# Patient Record
Sex: Female | Born: 1980 | Race: Black or African American | Hispanic: No | Marital: Single | Smoking: Never smoker
Health system: Southern US, Community
[De-identification: ages and names within clinical notes are randomized; demographics above are authoritative.]

## PROBLEM LIST (undated history)

## (undated) DIAGNOSIS — G35 Multiple sclerosis: Secondary | ICD-10-CM

## (undated) DIAGNOSIS — G35D Multiple sclerosis, unspecified: Secondary | ICD-10-CM

---

## 2011-11-17 ENCOUNTER — Emergency Department: Payer: Self-pay | Admitting: Emergency Medicine

## 2011-11-17 LAB — CK: CK, Total: 101 U/L (ref 21–215)

## 2011-11-17 LAB — CBC
HCT: 32.1 % — ABNORMAL LOW (ref 35.0–47.0)
HGB: 10.8 g/dL — ABNORMAL LOW (ref 12.0–16.0)
MCH: 29.2 pg (ref 26.0–34.0)
MCV: 87 fL (ref 80–100)
RDW: 14.7 % — ABNORMAL HIGH (ref 11.5–14.5)

## 2011-11-17 LAB — COMPREHENSIVE METABOLIC PANEL
Albumin: 3.3 g/dL — ABNORMAL LOW (ref 3.4–5.0)
Alkaline Phosphatase: 45 U/L — ABNORMAL LOW (ref 50–136)
Anion Gap: 10 (ref 7–16)
Bilirubin,Total: 0.5 mg/dL (ref 0.2–1.0)
Calcium, Total: 8.3 mg/dL — ABNORMAL LOW (ref 8.5–10.1)
Chloride: 109 mmol/L — ABNORMAL HIGH (ref 98–107)
Co2: 22 mmol/L (ref 21–32)
EGFR (African American): >60
Glucose: 115 mg/dL — ABNORMAL HIGH (ref 65–99)
Potassium: 3.4 mmol/L — ABNORMAL LOW (ref 3.5–5.1)
SGOT(AST): 17 U/L (ref 15–37)
SGPT (ALT): 18 U/L
Sodium: 141 mmol/L (ref 136–145)

## 2011-11-17 LAB — ETHANOL: Ethanol: <3 mg/dL

## 2016-12-05 ENCOUNTER — Emergency Department (HOSPITAL_COMMUNITY)
Admission: EM | Admit: 2016-12-05 | Discharge: 2016-12-05 | Discharge status: Against Medical Advice | Payer: Medicare Other | Attending: Emergency Medicine | Admitting: Emergency Medicine

## 2016-12-05 ENCOUNTER — Encounter (HOSPITAL_COMMUNITY): Payer: Self-pay | Admitting: Emergency Medicine

## 2016-12-05 DIAGNOSIS — R1110 Vomiting, unspecified: Secondary | ICD-10-CM | POA: Insufficient documentation

## 2016-12-05 DIAGNOSIS — R1031 Right lower quadrant pain: Secondary | ICD-10-CM | POA: Insufficient documentation

## 2016-12-05 HISTORY — DX: Multiple sclerosis: G35

## 2016-12-05 HISTORY — DX: Multiple sclerosis, unspecified: G35.D

## 2016-12-05 LAB — COMPREHENSIVE METABOLIC PANEL
ALT: 16 U/L (ref 14–54)
AST: 23 U/L (ref 15–41)
Albumin: 4.2 g/dL (ref 3.5–5.0)
Alkaline Phosphatase: 54 U/L (ref 38–126)
Anion gap: 10 (ref 5–15)
BUN: 7 mg/dL (ref 6–20)
CHLORIDE: 104 mmol/L (ref 101–111)
CO2: 23 mmol/L (ref 22–32)
Calcium: 9.4 mg/dL (ref 8.9–10.3)
Creatinine, Ser: 0.68 mg/dL (ref 0.44–1.00)
GFR calc Af Amer: >60 mL/min (ref >60)
GFR calc non Af Amer: >60 mL/min (ref >60)
Glucose, Bld: 117 mg/dL — ABNORMAL HIGH (ref 65–99)
Potassium: 3.7 mmol/L (ref 3.5–5.1)
SODIUM: 137 mmol/L (ref 135–145)
Total Bilirubin: 0.4 mg/dL (ref 0.3–1.2)
Total Protein: 7.2 g/dL (ref 6.5–8.1)

## 2016-12-05 LAB — CBC
HCT: 38.8 % (ref 36.0–46.0)
Hemoglobin: 12.8 g/dL (ref 12.0–15.0)
MCH: 29 pg (ref 26.0–34.0)
MCHC: 33 g/dL (ref 30.0–36.0)
MCV: 87.8 fL (ref 78.0–100.0)
PLATELETS: 231 10*3/uL (ref 150–400)
RBC: 4.42 MIL/uL (ref 3.87–5.11)
RDW: 13.9 % (ref 11.5–15.5)
WBC: 7.2 10*3/uL (ref 4.0–10.5)

## 2016-12-05 LAB — LIPASE, BLOOD: LIPASE: 37 U/L (ref 11–51)

## 2016-12-05 LAB — HCG, QUANTITATIVE, PREGNANCY: hCG, Beta Chain, Quant, S: <1 m[IU]/mL (ref <5)

## 2016-12-05 MED ORDER — ONDANSETRON 4 MG PO TBDP
ORAL_TABLET | ORAL | Status: AC
  Filled 2016-12-05: qty 1

## 2016-12-05 MED ORDER — ONDANSETRON 4 MG PO TBDP: 4.0000 mg | ORAL_TABLET | Freq: Once | ORAL | Status: DC | PRN

## 2016-12-05 NOTE — ED Triage Notes (Signed)
Pt woke up around 1am complaining of RLQ pain.  Pt is also been vomiting.  No fever.  Spouse states that she has been crying in pain for the past two hours.

## 2018-12-13 ENCOUNTER — Emergency Department (HOSPITAL_COMMUNITY)
Admission: EM | Admit: 2018-12-13 | Discharge: 2018-12-13 | Disposition: A | Discharge status: Home / Self Care | Payer: Medicare Other | Attending: Emergency Medicine | Admitting: Emergency Medicine

## 2018-12-13 ENCOUNTER — Other Ambulatory Visit: Payer: Self-pay

## 2018-12-13 DIAGNOSIS — R569 Unspecified convulsions: Secondary | ICD-10-CM | POA: Insufficient documentation

## 2018-12-13 DIAGNOSIS — Z659 Problem related to unspecified psychosocial circumstances: Secondary | ICD-10-CM | POA: Diagnosis not present

## 2018-12-13 LAB — CBC
HCT: 37.4 % (ref 36.0–46.0)
Hemoglobin: 12.6 g/dL (ref 12.0–15.0)
MCH: 32.9 pg (ref 26.0–34.0)
MCHC: 33.7 g/dL (ref 30.0–36.0)
MCV: 97.7 fL (ref 80.0–100.0)
Platelets: 191 10*3/uL (ref 150–400)
RBC: 3.83 MIL/uL — ABNORMAL LOW (ref 3.87–5.11)
RDW: 14.2 % (ref 11.5–15.5)
WBC: 6.8 10*3/uL (ref 4.0–10.5)
nRBC: 0 % (ref 0.0–0.2)

## 2018-12-13 LAB — RAPID URINE DRUG SCREEN, HOSP PERFORMED
Amphetamines: NOT DETECTED
Barbiturates: NOT DETECTED
Benzodiazepines: POSITIVE — AB
Cocaine: NOT DETECTED
Opiates: NOT DETECTED
Tetrahydrocannabinol: POSITIVE — AB

## 2018-12-13 LAB — COMPREHENSIVE METABOLIC PANEL
ALT: 21 U/L (ref 0–44)
AST: 22 U/L (ref 15–41)
Albumin: 4.2 g/dL (ref 3.5–5.0)
Alkaline Phosphatase: 46 U/L (ref 38–126)
Anion gap: 13 (ref 5–15)
BUN: 6 mg/dL (ref 6–20)
CO2: 18 mmol/L — ABNORMAL LOW (ref 22–32)
Calcium: 9.4 mg/dL (ref 8.9–10.3)
Chloride: 108 mmol/L (ref 98–111)
Creatinine, Ser: 0.72 mg/dL (ref 0.44–1.00)
GFR calc Af Amer: >60 mL/min (ref >60)
GFR calc non Af Amer: >60 mL/min (ref >60)
Glucose, Bld: 93 mg/dL (ref 70–99)
Potassium: 3.4 mmol/L — ABNORMAL LOW (ref 3.5–5.1)
Sodium: 139 mmol/L (ref 135–145)
Total Bilirubin: 0.6 mg/dL (ref 0.3–1.2)
Total Protein: 6.6 g/dL (ref 6.5–8.1)

## 2018-12-13 LAB — URINALYSIS, ROUTINE W REFLEX MICROSCOPIC
Bilirubin Urine: NEGATIVE
Glucose, UA: NEGATIVE mg/dL
Hgb urine dipstick: NEGATIVE
Ketones, ur: NEGATIVE mg/dL
Leukocytes,Ua: NEGATIVE
Nitrite: NEGATIVE
Protein, ur: NEGATIVE mg/dL
Specific Gravity, Urine: 1.024 (ref 1.005–1.030)
pH: 7 (ref 5.0–8.0)

## 2018-12-13 LAB — MAGNESIUM: Magnesium: 1.9 mg/dL (ref 1.7–2.4)

## 2018-12-13 LAB — I-STAT BETA HCG BLOOD, ED (MC, WL, AP ONLY): I-stat hCG, quantitative: <5 m[IU]/mL (ref <5)

## 2018-12-13 LAB — CBG MONITORING, ED: Glucose-Capillary: 88 mg/dL (ref 70–99)

## 2018-12-13 LAB — PHOSPHORUS: Phosphorus: 3.2 mg/dL (ref 2.5–4.6)

## 2018-12-13 NOTE — Discharge Instructions (Addendum)
Contact a health care provider if: Your seizures change or become more frequent. You continue to have seizures after treatment. Get help right away if: You injure yourself during a seizure. You have one seizure after another. You have trouble recovering from a seizure. You have chest pain or trouble breathing. You have a seizure that lasts longer than 5 minutes.
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
## 2018-12-13 NOTE — ED Notes (Signed)
Pt wanted to take her home meds , per Abby she can , pt given milk per her request to take the meds
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
## 2018-12-14 LAB — URINE CULTURE

## 2019-04-24 ENCOUNTER — Emergency Department (HOSPITAL_COMMUNITY): Payer: Medicare Other

## 2019-04-24 ENCOUNTER — Emergency Department (HOSPITAL_COMMUNITY)
Admission: EM | Admit: 2019-04-24 | Discharge: 2019-04-24 | Disposition: A | Discharge status: Home / Self Care | Payer: Medicare Other | Attending: Emergency Medicine | Admitting: Emergency Medicine

## 2019-04-24 DIAGNOSIS — Y999 Unspecified external cause status: Secondary | ICD-10-CM | POA: Insufficient documentation

## 2019-04-24 DIAGNOSIS — Y929 Unspecified place or not applicable: Secondary | ICD-10-CM | POA: Diagnosis not present

## 2019-04-24 DIAGNOSIS — S0990XA Unspecified injury of head, initial encounter: Secondary | ICD-10-CM | POA: Insufficient documentation

## 2019-04-24 DIAGNOSIS — M545 Low back pain, unspecified: Secondary | ICD-10-CM

## 2019-04-24 DIAGNOSIS — Y939 Activity, unspecified: Secondary | ICD-10-CM | POA: Insufficient documentation

## 2019-04-24 LAB — I-STAT BETA HCG BLOOD, ED (MC, WL, AP ONLY): I-stat hCG, quantitative: <5 m[IU]/mL (ref <5)

## 2019-04-24 MED ORDER — FENTANYL CITRATE (PF) 100 MCG/2ML IJ SOLN
50.0000 ug | Freq: Once | INTRAMUSCULAR | Status: AC
  Administered 2019-04-24: 03:00:00 50 ug via INTRAVENOUS
  Filled 2019-04-24: qty 2
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
## 2019-04-24 NOTE — ED Notes (Signed)
Patient verbalizes understanding of discharge instructions. Opportunity for questioning and answers were provided. Armband removed by staff, pt discharged from ED. Pt. ambulatory and discharged home.  
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
## 2019-04-24 NOTE — ED Notes (Signed)
C-Collar applied from EMS
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
